# Patient Record
Sex: Female | Born: 1960 | Race: Black or African American | Hispanic: No | Marital: Single | Smoking: Former smoker
Health system: Southern US, Community
[De-identification: ages and names within clinical notes are randomized; demographics above are authoritative.]

---

## 2008-10-15 ENCOUNTER — Emergency Department (HOSPITAL_COMMUNITY): Admission: EM | Admit: 2008-10-15 | Discharge: 2008-10-15 | Payer: Self-pay | Admitting: Emergency Medicine

## 2009-12-27 ENCOUNTER — Ambulatory Visit (HOSPITAL_COMMUNITY)
Admission: RE | Admit: 2009-12-27 | Discharge: 2009-12-27 | Payer: Self-pay | Source: Home / Self Care | Admitting: Obstetrics and Gynecology

## 2010-01-30 ENCOUNTER — Emergency Department (HOSPITAL_COMMUNITY)
Admission: EM | Admit: 2010-01-30 | Discharge: 2010-01-30 | Payer: Self-pay | Source: Home / Self Care | Admitting: Family Medicine

## 2010-04-27 ENCOUNTER — Inpatient Hospital Stay (INDEPENDENT_AMBULATORY_CARE_PROVIDER_SITE_OTHER)
Admission: RE | Admit: 2010-04-27 | Discharge: 2010-04-27 | Disposition: A | Discharge status: Home / Self Care | Payer: BC Managed Care – PPO | Source: Ambulatory Visit | Attending: Emergency Medicine | Admitting: Emergency Medicine

## 2010-04-27 DIAGNOSIS — J45909 Unspecified asthma, uncomplicated: Secondary | ICD-10-CM

## 2010-04-27 DIAGNOSIS — J309 Allergic rhinitis, unspecified: Secondary | ICD-10-CM

## 2010-04-27 DIAGNOSIS — L259 Unspecified contact dermatitis, unspecified cause: Secondary | ICD-10-CM

## 2010-08-28 ENCOUNTER — Inpatient Hospital Stay (INDEPENDENT_AMBULATORY_CARE_PROVIDER_SITE_OTHER)
Admission: RE | Admit: 2010-08-28 | Discharge: 2010-08-28 | Disposition: A | Discharge status: Home / Self Care | Payer: Self-pay | Source: Ambulatory Visit | Attending: Family Medicine | Admitting: Family Medicine

## 2010-08-28 DIAGNOSIS — T148XXA Other injury of unspecified body region, initial encounter: Secondary | ICD-10-CM

## 2010-11-03 ENCOUNTER — Emergency Department (HOSPITAL_COMMUNITY)
Admission: EM | Admit: 2010-11-03 | Discharge: 2010-11-04 | Disposition: A | Discharge status: Home / Self Care | Payer: Worker's Compensation | Attending: Emergency Medicine | Admitting: Emergency Medicine

## 2010-11-03 DIAGNOSIS — R51 Headache: Secondary | ICD-10-CM | POA: Insufficient documentation

## 2010-11-03 DIAGNOSIS — M542 Cervicalgia: Secondary | ICD-10-CM | POA: Insufficient documentation

## 2010-11-03 DIAGNOSIS — M25519 Pain in unspecified shoulder: Secondary | ICD-10-CM | POA: Insufficient documentation

## 2010-11-04 ENCOUNTER — Emergency Department (HOSPITAL_COMMUNITY): Payer: Worker's Compensation

## 2012-04-29 ENCOUNTER — Emergency Department (HOSPITAL_COMMUNITY)
Admission: EM | Admit: 2012-04-29 | Discharge: 2012-04-29 | Disposition: A | Discharge status: Home / Self Care | Payer: Self-pay | Attending: Emergency Medicine | Admitting: Emergency Medicine

## 2012-04-29 ENCOUNTER — Encounter (HOSPITAL_COMMUNITY): Payer: Self-pay | Admitting: Emergency Medicine

## 2012-04-29 DIAGNOSIS — IMO0001 Reserved for inherently not codable concepts without codable children: Secondary | ICD-10-CM | POA: Insufficient documentation

## 2012-04-29 DIAGNOSIS — R109 Unspecified abdominal pain: Secondary | ICD-10-CM | POA: Insufficient documentation

## 2012-04-29 DIAGNOSIS — K5289 Other specified noninfective gastroenteritis and colitis: Secondary | ICD-10-CM | POA: Insufficient documentation

## 2012-04-29 DIAGNOSIS — K529 Noninfective gastroenteritis and colitis, unspecified: Secondary | ICD-10-CM

## 2012-04-29 DIAGNOSIS — R112 Nausea with vomiting, unspecified: Secondary | ICD-10-CM | POA: Insufficient documentation

## 2012-04-29 MED ORDER — DIPHENOXYLATE-ATROPINE 2.5-0.025 MG PO TABS: 1.0000 | ORAL_TABLET | Freq: Four times a day (QID) | ORAL | Status: DC | PRN

## 2012-04-29 MED ORDER — ONDANSETRON HCL 4 MG PO TABS: 4.0000 mg | ORAL_TABLET | Freq: Four times a day (QID) | ORAL | Status: DC

## 2012-04-29 NOTE — ED Notes (Signed)
Pt c/o of abd cramping associated with nausea vomiting diarrhea. States that symptoms are better today.

## 2012-04-29 NOTE — ED Provider Notes (Signed)
History     CSN: [id number]  Arrival date & time 04/29/12  1257   First MD Initiated Contact with Patient 04/29/12 1308      No chief complaint on file.   (Consider location/radiation/quality/duration/timing/severity/associated sxs/prior treatment) HPI Comments: Patient comes to the ER for evaluation of nausea, vomiting, diarrhea, and generalized myalgias with abdominal cramping. Symptoms began 2 days ago. She reports that she is much better today, but still having some diarrhea. She did not document any fevers. Patient reports that she'll need a work note to get back to work. She also wants to make sure symptoms are completely resolved before she goes to work in 3 days.   No past medical history on file.  No past surgical history on file.  No family history on file.  History  Substance Use Topics  . Smoking status: Not on file  . Smokeless tobacco: Not on file  . Alcohol Use: Not on file    OB History   No data available      Review of Systems  Constitutional: Negative for fever.  Gastrointestinal: Positive for nausea, vomiting, abdominal pain and diarrhea.  All other systems reviewed and are negative.    Allergies  Review of patient's allergies indicates not on file.  Home Medications  No current outpatient prescriptions on file.  There were no vitals taken for this visit.  Physical Exam  Constitutional: She is oriented to person, place, and time. She appears well-developed and well-nourished. No distress.  HENT:  Head: Normocephalic and atraumatic.  Right Ear: Hearing normal.  Nose: Nose normal.  Mouth/Throat: Oropharynx is clear and moist and mucous membranes are normal.  Eyes: Conjunctivae and EOM are normal. Pupils are equal, round, and reactive to light.  Neck: Normal range of motion. Neck supple.  Cardiovascular: Normal rate, regular rhythm, S1 normal and S2 normal.  Exam reveals no gallop and no friction rub.   No murmur heard. Pulmonary/Chest:  Effort normal and breath sounds normal. No respiratory distress. She exhibits no tenderness.  Abdominal: Soft. Normal appearance and bowel sounds are normal. There is no hepatosplenomegaly. There is no tenderness. There is no rebound, no guarding, no tenderness at McBurney's point and negative Murphy's sign. No hernia.  Musculoskeletal: Normal range of motion.  Neurological: She is alert and oriented to person, place, and time. She has normal strength. No cranial nerve deficit or sensory deficit. Coordination normal. GCS eye subscore is 4. GCS verbal subscore is 5. GCS motor subscore is 6.  Skin: Skin is warm, dry and intact. No rash noted. No cyanosis.  Psychiatric: She has a normal mood and affect. Her speech is normal and behavior is normal. Thought content normal.    ED Course  Procedures (including critical care time)  Labs Reviewed - No data to display No results found.   Diagnosis: Gastroenteritis    MDM  She presents with nausea, vomiting, diarrhea with abdominal cramping for 2 days, now much improved. This is consistent with the tablet gastroenteritis in the community currently. She does not have any clinical signs of dehydration at this time. As she is feeling better, I feel it is reasonable to provide her with some symptomatic relief, release her back to work for her next shift.        Gilda Crease, MD 04/29/12 1320

## 2012-12-21 ENCOUNTER — Encounter (HOSPITAL_COMMUNITY): Payer: Self-pay | Admitting: Emergency Medicine

## 2012-12-21 ENCOUNTER — Emergency Department (INDEPENDENT_AMBULATORY_CARE_PROVIDER_SITE_OTHER)
Admission: EM | Admit: 2012-12-21 | Discharge: 2012-12-21 | Disposition: A | Discharge status: Home / Self Care | Payer: Self-pay | Source: Home / Self Care | Attending: Emergency Medicine | Admitting: Emergency Medicine

## 2012-12-21 ENCOUNTER — Other Ambulatory Visit (HOSPITAL_COMMUNITY)
Admission: RE | Admit: 2012-12-21 | Discharge: 2012-12-21 | Disposition: A | Discharge status: Home / Self Care | Payer: Self-pay | Source: Ambulatory Visit | Attending: Family Medicine | Admitting: Family Medicine

## 2012-12-21 DIAGNOSIS — N760 Acute vaginitis: Secondary | ICD-10-CM | POA: Insufficient documentation

## 2012-12-21 DIAGNOSIS — B3731 Acute candidiasis of vulva and vagina: Secondary | ICD-10-CM

## 2012-12-21 DIAGNOSIS — B373 Candidiasis of vulva and vagina: Secondary | ICD-10-CM

## 2012-12-21 DIAGNOSIS — Z113 Encounter for screening for infections with a predominantly sexual mode of transmission: Secondary | ICD-10-CM | POA: Insufficient documentation

## 2012-12-21 LAB — POCT URINALYSIS DIP (DEVICE)
Hgb urine dipstick: NEGATIVE
Nitrite: NEGATIVE
Protein, ur: NEGATIVE mg/dL
Urobilinogen, UA: 1 mg/dL (ref 0.0–1.0)
pH: 7 (ref 5.0–8.0)

## 2012-12-21 MED ORDER — FLUCONAZOLE 150 MG PO TABS: ORAL_TABLET | ORAL | Status: DC

## 2012-12-21 NOTE — ED Provider Notes (Signed)
CSN: [id number]     Arrival date & time 12/21/12  1117 History   First MD Initiated Contact with Patient 12/21/12 1151     Chief Complaint  Patient presents with  . Abdominal Pain   (Consider location/radiation/quality/duration/timing/severity/associated sxs/prior Treatment)  Patient is a 52 y.o. female presenting with abdominal pain. The history is provided by the patient.  Abdominal Pain This is a new problem. The current episode started more than 2 days ago. The problem has been gradually worsening. Associated symptoms include abdominal pain. Nothing aggravates the symptoms.    The patient presents today with lower abdominal pain radiating into her lower back for a "couple of days".  The shouldn't stay she engaged in unprotected sex 2 days ago and there was a slight pink vaginal discharge following intercourse.  History reviewed. No pertinent past medical history. History reviewed. No pertinent past surgical history. History reviewed. No pertinent family history. History  Substance Use Topics  . Smoking status: Not on file  . Smokeless tobacco: Not on file  . Alcohol Use: Not on file   OB History   Grav Para Term Preterm Abortions TAB SAB Ect Mult Living                 Review of Systems  Constitutional: Negative.   HENT: Negative.   Eyes: Negative.   Respiratory: Negative.   Cardiovascular: Negative.   Gastrointestinal: Positive for abdominal pain. Negative for nausea, vomiting, diarrhea, blood in stool and abdominal distention.  Endocrine: Negative.   Genitourinary: Positive for vaginal discharge. Negative for dysuria, urgency, frequency, flank pain, vaginal bleeding and genital sores.       Denies vaginal itching or tenderness. Reports some vaginal discharge but no odor.  Musculoskeletal: Negative.   Skin: Negative.   Allergic/Immunologic: Negative.   Neurological: Negative.   Hematological: Negative.   Psychiatric/Behavioral: Negative.     Allergies  Review of  patient's allergies indicates no known allergies.  Home Medications   Current Outpatient Rx  Name  Route  Sig  Dispense  Refill  . diphenoxylate-atropine (LOMOTIL) 2.5-0.025 MG per tablet   Oral   Take 1 tablet by mouth 4 (four) times daily as needed for diarrhea or loose stools.   30 tablet   0   . fluconazole (DIFLUCAN) 150 MG tablet      Take two tablets (300mg ) by mouth -  3 days apart.   4 tablet   0   . fluocinonide cream (LIDEX) 0.05 %   Topical   Apply 1 application topically 3 (three) times daily. Applied to hands         . ondansetron (ZOFRAN) 4 MG tablet   Oral   Take 1 tablet (4 mg total) by mouth every 6 (six) hours.   12 tablet   0    BP 134/72  Pulse 64  Temp(Src) 98.2 F (36.8 C) (Oral)  Resp 18  SpO2 100%  Physical Exam  Nursing note and vitals reviewed. Constitutional: She appears well-developed and well-nourished.  Cardiovascular: Normal rate, regular rhythm, normal heart sounds and intact distal pulses.  Exam reveals no gallop and no friction rub.   No murmur heard. Pulmonary/Chest: Effort normal and breath sounds normal. No respiratory distress. She has no wheezes. She has no rales. She exhibits no tenderness.  Abdominal: Soft. Bowel sounds are normal. She exhibits no distension and no mass. There is tenderness. There is no rebound and no guarding.  Patient reports mild abdominal tenderness with deep palpation in  left lower quadrant.  Genitourinary: There is no rash, tenderness, lesion or injury on the right labia. There is no rash, tenderness, lesion or injury on the left labia. No tenderness around the vagina. No foreign body around the vagina. No signs of injury around the vagina. Vaginal discharge found.  Patient has a history of hysterectomy which included her cervix. Patient has therapy amounts of cottage cheeselike discharge present in the vaginal vault moderate friability noted upon examination.     ED Course  Procedures (including  critical care time) Labs Review Labs Reviewed  POCT URINALYSIS DIP (DEVICE) - Abnormal; Notable for the following:    Ketones, ur TRACE (*)    Leukocytes, UA TRACE (*)    All other components within normal limits  CERVICOVAGINAL ANCILLARY ONLY   Imaging Review No results found.   MDM   Differentials include gonorrhea,Chlamydia, bacterial vaginosis and trichomoniasis related to high risk sexual behavior. Lab results pending.  1. Candida vaginitis    Meds ordered this encounter  Medications  . fluconazole (DIFLUCAN) 150 MG tablet    Sig: Take two tablets (300mg ) by mouth -  3 days apart.    Dispense:  4 tablet    Refill:  0   Patient verbalizes understanding of plan of care.      Weber Cooks, NP 12/21/12 1610

## 2012-12-21 NOTE — ED Notes (Signed)
C/o lower abdomen pain radiating to lower back for three days now States pain does go down her thighs States she has seen a little discharge Denies pain when urinating

## 2012-12-21 NOTE — ED Provider Notes (Signed)
Medical screening examination/treatment/procedure(s) were performed by non-physician practitioner and as supervising physician I was immediately available for consultation/collaboration.  Leslee Home, M.D.  Reuben Likes, MD 12/21/12 [phone]

## 2012-12-23 NOTE — ED Notes (Signed)
GC/Chlamydia neg., Affirm: Candida pos., Gardnerella and Trich neg.  Pt. adequately treated with Diflucan. Vassie Moselle 12/23/2012
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
## 2013-01-29 ENCOUNTER — Ambulatory Visit (INDEPENDENT_AMBULATORY_CARE_PROVIDER_SITE_OTHER): Payer: PRIVATE HEALTH INSURANCE | Admitting: Family Medicine

## 2013-01-29 VITALS — BP 122/84 | HR 101 | Temp 98.6°F | Resp 17 | Ht 64.5 in | Wt 135.0 lb

## 2013-01-29 DIAGNOSIS — J189 Pneumonia, unspecified organism: Secondary | ICD-10-CM

## 2013-01-29 DIAGNOSIS — F17200 Nicotine dependence, unspecified, uncomplicated: Secondary | ICD-10-CM

## 2013-01-29 DIAGNOSIS — J441 Chronic obstructive pulmonary disease with (acute) exacerbation: Secondary | ICD-10-CM

## 2013-01-29 MED ORDER — GUAIFENESIN-CODEINE 100-10 MG/5ML PO SOLN: 5.0000 mL | ORAL | Status: AC | PRN

## 2013-01-29 MED ORDER — PREDNISONE 20 MG PO TABS: 40.0000 mg | ORAL_TABLET | Freq: Every day | ORAL | Status: AC

## 2013-01-29 MED ORDER — LEVOFLOXACIN 500 MG PO TABS: 500.0000 mg | ORAL_TABLET | Freq: Every day | ORAL | Status: AC

## 2013-01-29 MED ORDER — ALBUTEROL SULFATE HFA 108 (90 BASE) MCG/ACT IN AERS: 2.0000 | INHALATION_SPRAY | RESPIRATORY_TRACT | Status: AC | PRN

## 2013-01-29 NOTE — Patient Instructions (Signed)

## 2013-01-29 NOTE — Progress Notes (Signed)
Subjective:    Patient ID: Elizabeth Hancock, female    DOB: [birth year], 53 y.o.   MRN: [id number] Chief Complaint  Patient presents with  . Shortness of Breath  . Chest Pain    HPI   Has been having URI sxs for several days with sneezing.  Last night her chest started getting tight and this morning she noted a lot of dyspnea on exertion. She was increasing her juice, theraflu, halls.  Has had chills but no fevers, no sweats.  Decreased appetite. No HAs but is having central sinus pressure behind her eyes. No sore throat, ear, or gum pain.  Cough started to be productive of yellow phlegm this morning.  Has been hearing herself wheeze first began last night. No h/o prev inhaler use or need. No palpitation or lower ext edema.  No recent antibiotic use. Has had a lot of sick contacts at work - no specific illness.  Smoking electronic cigarettes - switched 1 1/2 mos and prior to that was smoking 1/2 ppd since 53 yo.  No h/o emphysema, COPD, asthma.   Changed insurace recently but was seeing Dr. Corky Mull at [facility].   Father had CAD - but he is deceased - prob dev around 30-50 yo.  No personal h/o heart disease.   History reviewed. No pertinent past medical history. No current outpatient prescriptions on file prior to visit.   No current facility-administered medications on file prior to visit.   No Known Allergies   Review of Systems  Constitutional: Positive for activity change, appetite change and fatigue. Negative for fever, chills and diaphoresis.  HENT: Positive for congestion, postnasal drip, rhinorrhea, sinus pressure, sneezing and sore throat. Negative for ear discharge, ear pain, nosebleeds, trouble swallowing and voice change.   Eyes: Negative for discharge and itching.  Respiratory: Positive for cough, shortness of breath and wheezing.   Cardiovascular: Positive for chest pain. Negative for palpitations and leg swelling.  Gastrointestinal: Negative for nausea and vomiting.    Musculoskeletal: Negative for neck pain and neck stiffness.  Skin: Negative for rash.  Neurological: Negative for dizziness, syncope and headaches.  Hematological: Positive for adenopathy.  Psychiatric/Behavioral: Positive for sleep disturbance.       BP 122/84  Pulse 101  Temp(Src) 98.6 F (37 C) (Oral)  Resp 17  Ht 5' 4.5" (1.638 m)  Wt 135 lb (61.236 kg)  BMI 22.82 kg/m2  SpO2 98% Objective:   Physical Exam  Constitutional: She is oriented to person, place, and time. She appears well-developed and well-nourished. She appears lethargic. She appears ill. No distress.  HENT:  Head: Normocephalic and atraumatic.  Right Ear: External ear and ear canal normal. Tympanic membrane is injected, erythematous and retracted. A middle ear effusion is present.  Left Ear: External ear and ear canal normal. Tympanic membrane is injected, erythematous and retracted. A middle ear effusion is present.  Nose: Mucosal edema present. No rhinorrhea. Right sinus exhibits maxillary sinus tenderness. Left sinus exhibits maxillary sinus tenderness.  Mouth/Throat: Uvula is midline and mucous membranes are normal. Posterior oropharyngeal erythema present. No oropharyngeal exudate, posterior oropharyngeal edema or tonsillar abscesses.  Eyes: Conjunctivae are normal. Right eye exhibits no discharge. Left eye exhibits no discharge. No scleral icterus.  Neck: Normal range of motion. Neck supple.  Cardiovascular: Normal rate, regular rhythm, normal heart sounds and intact distal pulses.   Pulmonary/Chest: Effort normal. No respiratory distress. She has no decreased breath sounds. She has wheezes. She has rhonchi.  Diffuse insp rhonchi  and exp wheeze throughout all lung fields.  Lymphadenopathy:       Head (right side): Submandibular adenopathy present. No preauricular and no posterior auricular adenopathy present.       Head (left side): Submandibular adenopathy present. No preauricular and no posterior auricular  adenopathy present.    She has cervical adenopathy.       Right cervical: Superficial cervical adenopathy present.       Left cervical: No superficial cervical adenopathy present.       Right: No supraclavicular adenopathy present.       Left: No supraclavicular adenopathy present.  Neurological: She is oriented to person, place, and time. She appears lethargic.  Skin: Skin is warm and dry. She is not diaphoretic. No erythema.  Psychiatric: She has a normal mood and affect. Her behavior is normal.      Assessment & Plan:  Tobacco use disorder  CAP (community acquired pneumonia)  COPD exacerbation Discussed options - pt doesn't think she has any insurance coverage - paying for insurance but when we called all they could say was "very limited coverage" - pt understandably upset and concerned about this. So will defer cxr/cbc/in-office neb treatment at this point and proceed w/ coverage w/ levaquin for presumed pna as well as prednisone burst for copd exac and wheezing due to exam. However, if not getting sig improvement w/in 24 hrs or worse at all, pt will RTC immed for further eval - pt understands and agrees to plan. Meds ordered this encounter  Medications  . levofloxacin (LEVAQUIN) 500 MG tablet    Sig: Take 1 tablet (500 mg total) by mouth daily.    Dispense:  7 tablet    Refill:  0  . predniSONE (DELTASONE) 20 MG tablet    Sig: Take 2 tablets (40 mg total) by mouth daily.    Dispense:  10 tablet    Refill:  0  . albuterol (PROVENTIL HFA;VENTOLIN HFA) 108 (90 BASE) MCG/ACT inhaler    Sig: Inhale 2 puffs into the lungs every 4 (four) hours as needed for wheezing or shortness of breath (cough, shortness of breath or wheezing.).    Dispense:  1 Inhaler    Refill:  1  . guaiFENesin-codeine 100-10 MG/5ML syrup    Sig: Take 5 mLs by mouth every 4 (four) hours as needed for cough.    Dispense:  180 mL    Refill:  0   Norberto SorensonEva Koleen Celia, MD MPH
